# Patient Record
Sex: Female | Born: 1955 | Race: White | Hispanic: No | Marital: Married | Smoking: Never smoker
Health system: Southern US, Community
[De-identification: ages and names within clinical notes are randomized; demographics above are authoritative.]

## PROBLEM LIST (undated history)

## (undated) DIAGNOSIS — F419 Anxiety disorder, unspecified: Secondary | ICD-10-CM

## (undated) DIAGNOSIS — F32A Depression, unspecified: Secondary | ICD-10-CM

## (undated) DIAGNOSIS — M1990 Unspecified osteoarthritis, unspecified site: Secondary | ICD-10-CM

## (undated) DIAGNOSIS — L900 Lichen sclerosus et atrophicus: Secondary | ICD-10-CM

## (undated) DIAGNOSIS — E7800 Pure hypercholesterolemia, unspecified: Secondary | ICD-10-CM

## (undated) DIAGNOSIS — T7840XA Allergy, unspecified, initial encounter: Secondary | ICD-10-CM

## (undated) DIAGNOSIS — D219 Benign neoplasm of connective and other soft tissue, unspecified: Secondary | ICD-10-CM

## (undated) DIAGNOSIS — M810 Age-related osteoporosis without current pathological fracture: Secondary | ICD-10-CM

## (undated) DIAGNOSIS — F329 Major depressive disorder, single episode, unspecified: Secondary | ICD-10-CM

## (undated) HISTORY — DX: Allergy, unspecified, initial encounter: T78.40XA

## (undated) HISTORY — PX: COLONOSCOPY: SHX174

## (undated) HISTORY — DX: Age-related osteoporosis without current pathological fracture: M81.0

## (undated) HISTORY — PX: POLYPECTOMY: SHX149

## (undated) HISTORY — DX: Lichen sclerosus et atrophicus: L90.0

## (undated) HISTORY — DX: Depression, unspecified: F32.A

## (undated) HISTORY — DX: Unspecified osteoarthritis, unspecified site: M19.90

## (undated) HISTORY — DX: Anxiety disorder, unspecified: F41.9

## (undated) HISTORY — DX: Major depressive disorder, single episode, unspecified: F32.9

## (undated) HISTORY — DX: Benign neoplasm of connective and other soft tissue, unspecified: D21.9

## (undated) HISTORY — DX: Pure hypercholesterolemia, unspecified: E78.00

---
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
## 1995-07-27 HISTORY — PX: DIAGNOSTIC LAPAROSCOPY: SUR761

## 1998-03-26 ENCOUNTER — Other Ambulatory Visit: Admission: RE | Admit: 1998-03-26 | Discharge: 1998-03-26 | Payer: Self-pay | Admitting: Obstetrics and Gynecology

## 2000-06-10 ENCOUNTER — Encounter: Admission: RE | Admit: 2000-06-10 | Discharge: 2000-06-10 | Payer: Self-pay | Admitting: Obstetrics and Gynecology

## 2000-06-10 ENCOUNTER — Encounter: Payer: Self-pay | Admitting: Obstetrics and Gynecology

## 2000-11-14 ENCOUNTER — Encounter: Admission: RE | Admit: 2000-11-14 | Discharge: 2000-11-14 | Payer: Self-pay | Admitting: Family Medicine

## 2000-11-14 ENCOUNTER — Encounter: Payer: Self-pay | Admitting: Family Medicine

## 2001-10-18 ENCOUNTER — Other Ambulatory Visit: Admission: RE | Admit: 2001-10-18 | Discharge: 2001-10-18 | Payer: Self-pay | Admitting: Obstetrics and Gynecology

## 2002-01-03 ENCOUNTER — Encounter: Admission: RE | Admit: 2002-01-03 | Discharge: 2002-01-03 | Payer: Self-pay | Admitting: Family Medicine

## 2002-01-03 ENCOUNTER — Encounter: Payer: Self-pay | Admitting: Family Medicine

## 2002-01-31 ENCOUNTER — Encounter: Admission: RE | Admit: 2002-01-31 | Discharge: 2002-01-31 | Payer: Self-pay | Admitting: Family Medicine

## 2002-01-31 ENCOUNTER — Encounter: Payer: Self-pay | Admitting: Family Medicine

## 2003-02-27 ENCOUNTER — Encounter: Payer: Self-pay | Admitting: Obstetrics and Gynecology

## 2003-02-27 ENCOUNTER — Encounter: Admission: RE | Admit: 2003-02-27 | Discharge: 2003-02-27 | Payer: Self-pay | Admitting: Obstetrics and Gynecology

## 2003-03-18 ENCOUNTER — Other Ambulatory Visit: Admission: RE | Admit: 2003-03-18 | Discharge: 2003-03-18 | Payer: Self-pay | Admitting: Obstetrics and Gynecology

## 2004-09-04 ENCOUNTER — Ambulatory Visit (HOSPITAL_COMMUNITY): Admission: RE | Admit: 2004-09-04 | Discharge: 2004-09-04 | Payer: Self-pay | Admitting: Obstetrics and Gynecology

## 2004-09-07 ENCOUNTER — Other Ambulatory Visit: Admission: RE | Admit: 2004-09-07 | Discharge: 2004-09-07 | Payer: Self-pay | Admitting: Obstetrics and Gynecology

## 2005-09-10 ENCOUNTER — Other Ambulatory Visit: Admission: RE | Admit: 2005-09-10 | Discharge: 2005-09-10 | Payer: Self-pay | Admitting: Obstetrics and Gynecology

## 2005-09-17 ENCOUNTER — Ambulatory Visit (HOSPITAL_COMMUNITY): Admission: RE | Admit: 2005-09-17 | Discharge: 2005-09-17 | Payer: Self-pay | Admitting: Obstetrics and Gynecology

## 2006-09-09 ENCOUNTER — Ambulatory Visit: Payer: Self-pay | Admitting: Internal Medicine

## 2006-09-19 ENCOUNTER — Ambulatory Visit (HOSPITAL_COMMUNITY): Admission: RE | Admit: 2006-09-19 | Discharge: 2006-09-19 | Payer: Self-pay | Admitting: Obstetrics and Gynecology

## 2006-09-22 ENCOUNTER — Encounter (INDEPENDENT_AMBULATORY_CARE_PROVIDER_SITE_OTHER): Payer: Self-pay | Admitting: *Deleted

## 2006-09-22 ENCOUNTER — Ambulatory Visit: Payer: Self-pay | Admitting: Internal Medicine

## 2006-10-07 ENCOUNTER — Other Ambulatory Visit: Admission: RE | Admit: 2006-10-07 | Discharge: 2006-10-07 | Payer: Self-pay | Admitting: Obstetrics and Gynecology

## 2008-05-03 ENCOUNTER — Ambulatory Visit (HOSPITAL_COMMUNITY): Admission: RE | Admit: 2008-05-03 | Discharge: 2008-05-03 | Payer: Self-pay | Admitting: Obstetrics and Gynecology

## 2008-05-09 ENCOUNTER — Ambulatory Visit: Payer: Self-pay | Admitting: Obstetrics and Gynecology

## 2008-05-09 ENCOUNTER — Other Ambulatory Visit: Admission: RE | Admit: 2008-05-09 | Discharge: 2008-05-09 | Payer: Self-pay | Admitting: Obstetrics and Gynecology

## 2008-05-09 ENCOUNTER — Encounter: Payer: Self-pay | Admitting: Obstetrics and Gynecology

## 2008-06-17 ENCOUNTER — Ambulatory Visit: Payer: Self-pay | Admitting: Obstetrics and Gynecology

## 2010-08-15 ENCOUNTER — Encounter: Payer: Self-pay | Admitting: Obstetrics and Gynecology

## 2011-08-11 ENCOUNTER — Other Ambulatory Visit (HOSPITAL_COMMUNITY): Payer: Self-pay | Admitting: Family Medicine

## 2011-08-11 DIAGNOSIS — Z1231 Encounter for screening mammogram for malignant neoplasm of breast: Secondary | ICD-10-CM

## 2011-08-25 ENCOUNTER — Encounter: Payer: Self-pay | Admitting: Gynecology

## 2011-08-25 DIAGNOSIS — N809 Endometriosis, unspecified: Secondary | ICD-10-CM | POA: Insufficient documentation

## 2011-09-01 ENCOUNTER — Other Ambulatory Visit: Payer: Self-pay | Admitting: Obstetrics and Gynecology

## 2011-09-01 DIAGNOSIS — Z13820 Encounter for screening for osteoporosis: Secondary | ICD-10-CM

## 2011-09-02 ENCOUNTER — Other Ambulatory Visit: Payer: Self-pay | Admitting: Family Medicine

## 2011-09-07 ENCOUNTER — Ambulatory Visit (INDEPENDENT_AMBULATORY_CARE_PROVIDER_SITE_OTHER): Payer: PRIVATE HEALTH INSURANCE

## 2011-09-07 DIAGNOSIS — M810 Age-related osteoporosis without current pathological fracture: Secondary | ICD-10-CM

## 2011-09-07 DIAGNOSIS — Z13820 Encounter for screening for osteoporosis: Secondary | ICD-10-CM

## 2011-09-07 DIAGNOSIS — M8580 Other specified disorders of bone density and structure, unspecified site: Secondary | ICD-10-CM

## 2011-09-08 ENCOUNTER — Ambulatory Visit (HOSPITAL_COMMUNITY): Payer: Self-pay

## 2011-09-10 ENCOUNTER — Ambulatory Visit (HOSPITAL_COMMUNITY)
Admission: RE | Admit: 2011-09-10 | Discharge: 2011-09-10 | Disposition: A | Discharge status: Home / Self Care | Payer: PRIVATE HEALTH INSURANCE | Source: Ambulatory Visit | Attending: Family Medicine | Admitting: Family Medicine

## 2011-09-10 DIAGNOSIS — Z1231 Encounter for screening mammogram for malignant neoplasm of breast: Secondary | ICD-10-CM | POA: Insufficient documentation

## 2011-09-17 ENCOUNTER — Other Ambulatory Visit (HOSPITAL_COMMUNITY)
Admission: RE | Admit: 2011-09-17 | Discharge: 2011-09-17 | Disposition: A | Discharge status: Home / Self Care | Payer: PRIVATE HEALTH INSURANCE | Source: Ambulatory Visit | Attending: Obstetrics and Gynecology | Admitting: Obstetrics and Gynecology

## 2011-09-17 ENCOUNTER — Ambulatory Visit (INDEPENDENT_AMBULATORY_CARE_PROVIDER_SITE_OTHER): Payer: PRIVATE HEALTH INSURANCE | Admitting: Obstetrics and Gynecology

## 2011-09-17 ENCOUNTER — Encounter: Payer: Self-pay | Admitting: Obstetrics and Gynecology

## 2011-09-17 VITALS — BP 112/66 | Ht 65.0 in | Wt 122.5 lb

## 2011-09-17 DIAGNOSIS — Z01419 Encounter for gynecological examination (general) (routine) without abnormal findings: Secondary | ICD-10-CM | POA: Insufficient documentation

## 2011-09-17 DIAGNOSIS — F329 Major depressive disorder, single episode, unspecified: Secondary | ICD-10-CM | POA: Insufficient documentation

## 2011-09-17 DIAGNOSIS — E7800 Pure hypercholesterolemia, unspecified: Secondary | ICD-10-CM | POA: Insufficient documentation

## 2011-09-17 NOTE — Progress Notes (Signed)
Patient came to see me today for her annual GYN exam. She does her lab through PCP. She is up-to-date on mammograms. We did not get her last report and she will get it for [place]. She is having no hot flashes. She is having no pelvic pain. She is having no vaginal bleeding. She does have vaginal dryness which responds to estrogen cream. We just did a followup bone density on her and She has more bone loss and is now osteoporotic. She has a calcium rich diet. She takes 1000 IU of D. Her D. Level is normal on the above. She is trying to exercise more. She is a nonsmoker nondrinker.  Physical examination:  Karen Hurst present Physical examination: HEENT within normal limits. Neck: Thyroid not large. No masses. Supraclavicular nodes: not enlarged. Breasts: Examined in both sitting midline position. No skin changes and no masses. Abdomen: Soft no guarding rebound or masses or hernia. Pelvic: External: Within normal limits. BUS: Within normal limits. Vaginal:within normal limits. Good estrogen effect. No evidence of cystocele rectocele or enterocele. Cervix: clean. Uterus: Normal size and shape. Adnexa: No masses. Rectovaginal exam: Confirmatory and negative. Extremities: Within normal limits.  Assessment: #1. Osteoporosis #2. Atrophic vaginitis  Plan: Discussed treatment for osteoporosis. Discussed oral and injectables. We are to try to get her approved for Prolia. Booklet given. Patient to get me her calcium level and mammogram results.

## 2011-09-22 ENCOUNTER — Encounter: Payer: Self-pay | Admitting: Internal Medicine

## 2011-10-14 ENCOUNTER — Telehealth: Payer: Self-pay | Admitting: *Deleted

## 2011-10-14 MED ORDER — RISEDRONATE SODIUM 150 MG PO TABS
150.0000 mg | ORAL_TABLET | ORAL | Status: DC
Start: 2011-10-14 — End: 2012-10-21

## 2011-10-14 NOTE — Telephone Encounter (Signed)
Message copied by Libby Maw on Thu Oct 14, 2011  9:27 AM ------      Message from: Trellis Paganini      Created: Fri Sep 17, 2011 12:30 PM       Please get patient approved for Prolia. She has had a recent calcium level done. Please call her and tell her she's approved.

## 2011-10-14 NOTE — Telephone Encounter (Signed)
Lm for patient to call.  Insurance will not cover Prolia so DG wants to rx Actonel 150mg  per month instead

## 2011-10-14 NOTE — Telephone Encounter (Signed)
Patient informed information

## 2012-05-18 ENCOUNTER — Encounter: Payer: Self-pay | Admitting: Internal Medicine

## 2012-09-28 ENCOUNTER — Other Ambulatory Visit: Payer: Self-pay | Admitting: Gynecology

## 2012-09-28 ENCOUNTER — Encounter: Payer: Self-pay | Admitting: Internal Medicine

## 2012-09-28 DIAGNOSIS — Z1231 Encounter for screening mammogram for malignant neoplasm of breast: Secondary | ICD-10-CM

## 2012-10-04 ENCOUNTER — Ambulatory Visit (HOSPITAL_COMMUNITY)
Admission: RE | Admit: 2012-10-04 | Discharge: 2012-10-04 | Disposition: A | Discharge status: Home / Self Care | Payer: PRIVATE HEALTH INSURANCE | Source: Ambulatory Visit | Attending: Gynecology | Admitting: Gynecology

## 2012-10-04 DIAGNOSIS — Z1231 Encounter for screening mammogram for malignant neoplasm of breast: Secondary | ICD-10-CM | POA: Insufficient documentation

## 2012-10-13 ENCOUNTER — Ambulatory Visit (AMBULATORY_SURGERY_CENTER): Payer: PRIVATE HEALTH INSURANCE | Admitting: *Deleted

## 2012-10-13 VITALS — Ht 65.0 in | Wt 120.0 lb

## 2012-10-13 DIAGNOSIS — Z1211 Encounter for screening for malignant neoplasm of colon: Secondary | ICD-10-CM

## 2012-10-13 MED ORDER — MOVIPREP 100 G PO SOLR: ORAL | Status: DC

## 2012-10-17 ENCOUNTER — Encounter: Payer: Self-pay | Admitting: Internal Medicine

## 2012-10-19 ENCOUNTER — Ambulatory Visit (INDEPENDENT_AMBULATORY_CARE_PROVIDER_SITE_OTHER): Payer: Self-pay | Admitting: Gynecology

## 2012-10-19 ENCOUNTER — Encounter: Payer: Self-pay | Admitting: Gynecology

## 2012-10-19 VITALS — BP 120/70 | Ht 65.0 in | Wt 122.0 lb

## 2012-10-19 DIAGNOSIS — N952 Postmenopausal atrophic vaginitis: Secondary | ICD-10-CM

## 2012-10-19 DIAGNOSIS — Z01419 Encounter for gynecological examination (general) (routine) without abnormal findings: Secondary | ICD-10-CM

## 2012-10-19 DIAGNOSIS — N852 Hypertrophy of uterus: Secondary | ICD-10-CM

## 2012-10-19 DIAGNOSIS — M810 Age-related osteoporosis without current pathological fracture: Secondary | ICD-10-CM

## 2012-10-19 MED ORDER — RISEDRONATE SODIUM 150 MG PO TABS: 150.0000 mg | ORAL_TABLET | ORAL | Status: DC

## 2012-10-19 NOTE — Patient Instructions (Signed)
Follow up for ultrasound as scheduled Try Vagifem as discussed, call me in follow up as to how it is working and for refill Follow up in one year for annual exam

## 2012-10-19 NOTE — Progress Notes (Signed)
Charmane Protzman 07-11-1956 [id number]        57 y.o.  G2P2002 for annual exam.  Former patient of Dr. Eda Paschal. Several issues noted below.  Past medical history,surgical history, medications, allergies, family history and social history were all reviewed and documented in the EPIC chart. ROS:  Was performed and pertinent positives and negatives are included in the history.  Exam: Kim assistant Filed Vitals:   10/19/12 1409  BP: 120/70  Height: 5\' 5"  (1.651 m)  Weight: 122 lb (55.339 kg)   General appearance  Normal Skin grossly normal Head/Neck normal with no cervical or supraclavicular adenopathy thyroid normal Lungs  clear Cardiac RR, without RMG Abdominal  soft, nontender, without masses, organomegaly or hernia Breasts  examined lying and sitting without masses, retractions, discharge or axillary adenopathy. Pelvic  Ext/BUS/vagina  normal with atrophic genital changes  Cervix  normal with atrophic genital changes  Uterus  bulky, midline and mobile nontender   Adnexa  Without masses or tenderness    Anus and perineum  normal   Rectovaginal  normal sphincter tone without palpated masses or tenderness.    Assessment/Plan:  57 y.o. [id number] postmenopausal female for annual exam.   1. Bulky uterus on exam. Not noted previously on other exams. Recommended baseline ultrasound for better definition. Patient will followup for this. Various scenarios to include normal pelvis, leiomyoma or ovarian cystic process reviewed. 2. Atrophic vaginitis. Using Premarin vaginal cream several times weekly. Doing well with this. Does have occasional hot flashes and night sweats but overall is doing well. I reviewed options to include OTC products such as soy, moisturizers and lubricants. Estrogen cream, Vagifem and Osphena. HRT also discussed. Risks to include stroke heart attack DVT breast cancer reviewed. Possible absorption with vaginal treatments and uterine stimulation as well as other risks  reviewed. After lengthy discussion patient wants trial of Vagifem and I gave her 3 sample cards to try. As she is already on vaginal estrogen we'll start her with twice weekly dosage. She will call me at the end of her samples in followup. If doing well I'll refill the Vagifem if not we'll refill the estrogen cream. 3. Osteoporosis. DEXA 08/2011. T score -2.5. On Actonel 150 mg for one year. Doing well with this and I refilled her times a year. 4. Mammography 09/2012 normal. Does show some density to the breasts. We'll continue with annual mammography. I recommended she consider 3-D next year. SBE monthly reviewed. 5. Pap smear 08/2011. No Pap smear done today. No history of abnormal Pap smears. Plan repeat at 3 year interval per current screening guidelines. 6. Colonoscopy 2008. Repeat at their recommended interval. 7. Health maintenance. No blood work done as it is all done through her primary physician's office. Follow up for ultrasound and Vagifem results. Otherwise annually.    Dara Lords MD, 3:03 PM 10/19/2012

## 2012-10-21 ENCOUNTER — Other Ambulatory Visit: Payer: Self-pay | Admitting: Obstetrics and Gynecology

## 2012-10-23 ENCOUNTER — Encounter: Payer: Self-pay | Admitting: Gynecology

## 2012-10-23 ENCOUNTER — Ambulatory Visit (INDEPENDENT_AMBULATORY_CARE_PROVIDER_SITE_OTHER): Payer: PRIVATE HEALTH INSURANCE | Admitting: Gynecology

## 2012-10-23 ENCOUNTER — Other Ambulatory Visit: Payer: Self-pay | Admitting: Gynecology

## 2012-10-23 ENCOUNTER — Ambulatory Visit (INDEPENDENT_AMBULATORY_CARE_PROVIDER_SITE_OTHER): Payer: PRIVATE HEALTH INSURANCE

## 2012-10-23 DIAGNOSIS — D251 Intramural leiomyoma of uterus: Secondary | ICD-10-CM

## 2012-10-23 DIAGNOSIS — N852 Hypertrophy of uterus: Secondary | ICD-10-CM

## 2012-10-23 DIAGNOSIS — D252 Subserosal leiomyoma of uterus: Secondary | ICD-10-CM

## 2012-10-23 DIAGNOSIS — D259 Leiomyoma of uterus, unspecified: Secondary | ICD-10-CM

## 2012-10-23 DIAGNOSIS — N83339 Acquired atrophy of ovary and fallopian tube, unspecified side: Secondary | ICD-10-CM

## 2012-10-23 NOTE — Progress Notes (Signed)
Patient follows up for ultrasound. Uterus felt bulky at annual exam never appreciated previously. My first exam on this patient.  Ultrasound shows uterus enlarged with several myomas the largest measuring 65 mm, 39 mm, 31 mm, 23 mm. Endometrial echo unable to be visualized due to the myomas. Right and left ovaries atrophic in appearance. Cul-de-sac negative.  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 Patient's asymptomatic at this point. Question whether this is enlarging given normal exam last year by Dr. Eda Paschal or present but not appreciated previously. Issues of leiomyosarcoma also discussed. Questions were answered. Recommend three-month followup exam for stability and she agrees with this. Sooner if any issues or symptoms.

## 2012-10-23 NOTE — Patient Instructions (Signed)
3 month follow up exam

## 2012-10-27 ENCOUNTER — Ambulatory Visit (AMBULATORY_SURGERY_CENTER): Payer: PRIVATE HEALTH INSURANCE | Admitting: Internal Medicine

## 2012-10-27 ENCOUNTER — Encounter: Payer: Self-pay | Admitting: Internal Medicine

## 2012-10-27 VITALS — BP 92/35 | HR 65 | Temp 98.2°F | Resp 28 | Ht 65.0 in | Wt 120.0 lb

## 2012-10-27 DIAGNOSIS — D126 Benign neoplasm of colon, unspecified: Secondary | ICD-10-CM

## 2012-10-27 DIAGNOSIS — Z1211 Encounter for screening for malignant neoplasm of colon: Secondary | ICD-10-CM

## 2012-10-27 MED ORDER — SODIUM CHLORIDE 0.9 % IV SOLN: 500.0000 mL | INTRAVENOUS | Status: DC

## 2012-10-27 NOTE — Progress Notes (Signed)
943- Pt still very drowsy; has to continually be reminded to stay awake.  Husband helps pt dressed and she is taken to the conference room to wake up further before discharge

## 2012-10-27 NOTE — Patient Instructions (Addendum)

## 2012-10-27 NOTE — Op Note (Signed)
[facility] [address] [place], [address]   COLONOSCOPY PROCEDURE REPORT  PATIENT: Karen Hurst, Karen Hurst  MR#: [id number] BIRTHDATE: Aug 16, 1955 , 56  yrs. old GENDER: Female ENDOSCOPIST: Hart Carwin, MD REFERRED BY:  Mosetta Putt, M.D. PROCEDURE DATE:  10/27/2012 PROCEDURE:   Colonoscopy, screening ASA CLASS:   Class I INDICATIONS:Patient's personal history of adenomatous colon polyps ,adenomatous polyp 2008 MEDICATIONS: MAC sedation, administered by CRNA and propofol (Diprivan) 400mg  IV  DESCRIPTION OF PROCEDURE:   After the risks and benefits and of the procedure were explained, informed consent was obtained.  A digital rectal exam revealed no abnormalities of the rectum.    The LB [id number]  endoscope was introduced through the anus and advanced to the cecum, which was identified by both the appendix and ileocecal valve .  The quality of the prep was excellent, using MoviPrep .  The instrument was then slowly withdrawn as the colon was fully examined.     COLON FINDINGS: A polypoid shaped pedunculated polyp ranging between 5-27mm in size was found in the ascending colon.  A polypectomy was performed with cold forceps.  The resection was complete and the polyp tissue was completely retrieved.     Retroflexed views revealed no abnormalities.     The scope was then withdrawn from the patient and the procedure completed.  COMPLICATIONS: There were no complications. ENDOSCOPIC IMPRESSION: Pedunculated polyp ranging between 5-67mm in size was found in the ascending colon; polypectomy was performed with cold forceps  RECOMMENDATIONS: 1.  Await pathology results 2.  High fiber diet   REPEAT EXAM: for Colonoscopy, pending biopsy results.  Marland Kitchen  if the polyp is adenomatous, we will suggest recall in 5 years  cc:  _______________________________ eSigned:  Hart Carwin, MD 10/27/2012 9:03 AM

## 2012-10-27 NOTE — Progress Notes (Signed)
Called to room to assist during endoscopic procedure.  Patient ID and intended procedure confirmed with present staff. Received instructions for my participation in the procedure from the performing physician.  

## 2012-10-27 NOTE — Progress Notes (Signed)
Patient did not experience any of the following events: a burn prior to discharge; a fall within the facility; wrong site/side/patient/procedure/implant event; or a hospital transfer or hospital admission upon discharge from the facility. (G8907) Patient did not have preoperative order for IV antibiotic SSI prophylaxis. (G8918)  

## 2012-10-30 ENCOUNTER — Telehealth: Payer: Self-pay | Admitting: *Deleted

## 2012-10-30 NOTE — Telephone Encounter (Signed)
  Follow up Call-  Call back number 10/27/2012  Post procedure Call Back phone  # [phone] cell  Permission to leave phone message Yes     Patient questions:  Do you have a fever, pain , or abdominal swelling? no Pain Score  0 *  Have you tolerated food without any problems? yes  Have you been able to return to your normal activities? yes  Do you have any questions about your discharge instructions: Diet   no Medications  no Follow up visit  no  Do you have questions or concerns about your Care? yes  Actions: * If pain score is 4 or above: No action needed, pain <4.

## 2012-10-31 ENCOUNTER — Encounter: Payer: Self-pay | Admitting: Internal Medicine

## 2013-01-19 ENCOUNTER — Ambulatory Visit (INDEPENDENT_AMBULATORY_CARE_PROVIDER_SITE_OTHER): Payer: PRIVATE HEALTH INSURANCE | Admitting: Gynecology

## 2013-01-19 ENCOUNTER — Encounter: Payer: Self-pay | Admitting: Gynecology

## 2013-01-19 ENCOUNTER — Other Ambulatory Visit: Payer: PRIVATE HEALTH INSURANCE

## 2013-01-19 DIAGNOSIS — D251 Intramural leiomyoma of uterus: Secondary | ICD-10-CM

## 2013-01-19 NOTE — Progress Notes (Signed)
Patient presents for followup exam. Had newly discovered leiomyoma at her annual exam March 2014. Her uterus felt bulky and I ordered an ultrasound which showed multiple myomas the largest measuring 65 mm. I asked her return in 3 months for reexamination just to make sure that her uterus remained stable.  Exam was Radiographer, therapeutic External BUS vagina normal. Cervix normal With atrophic changes. Uterus retroverted bulky consistent with past exam. adnexa without masses or tenderness.  Assessment and plan: Leiomyoma, stable on exam. Followup March 2015 for annual exam. Sooner if any issues.

## 2013-01-19 NOTE — Patient Instructions (Signed)
Followup March 2015 for your annual exam.

## 2013-11-04 ENCOUNTER — Other Ambulatory Visit: Payer: Self-pay | Admitting: Obstetrics and Gynecology

## 2013-11-28 ENCOUNTER — Telehealth: Payer: Self-pay | Admitting: *Deleted

## 2013-11-28 ENCOUNTER — Other Ambulatory Visit: Payer: Self-pay | Admitting: Gynecology

## 2013-11-28 DIAGNOSIS — Z1231 Encounter for screening mammogram for malignant neoplasm of breast: Secondary | ICD-10-CM

## 2013-11-28 NOTE — Telephone Encounter (Signed)
Pt annual scheduled on 12/20/13 asked if dexa should be scheduled before annual or after I told pt she can schedule any time.

## 2013-11-30 ENCOUNTER — Ambulatory Visit (HOSPITAL_COMMUNITY)
Admission: RE | Admit: 2013-11-30 | Discharge: 2013-11-30 | Disposition: A | Discharge status: Home / Self Care | Payer: PRIVATE HEALTH INSURANCE | Source: Ambulatory Visit | Attending: Gynecology | Admitting: Gynecology

## 2013-11-30 DIAGNOSIS — Z1231 Encounter for screening mammogram for malignant neoplasm of breast: Secondary | ICD-10-CM | POA: Insufficient documentation

## 2013-12-20 ENCOUNTER — Encounter: Payer: Self-pay | Admitting: Gynecology

## 2013-12-20 ENCOUNTER — Ambulatory Visit (INDEPENDENT_AMBULATORY_CARE_PROVIDER_SITE_OTHER): Payer: PRIVATE HEALTH INSURANCE | Admitting: Gynecology

## 2013-12-20 VITALS — BP 120/76 | Ht 65.0 in | Wt 134.0 lb

## 2013-12-20 DIAGNOSIS — N952 Postmenopausal atrophic vaginitis: Secondary | ICD-10-CM

## 2013-12-20 DIAGNOSIS — Z01419 Encounter for gynecological examination (general) (routine) without abnormal findings: Secondary | ICD-10-CM

## 2013-12-20 DIAGNOSIS — D251 Intramural leiomyoma of uterus: Secondary | ICD-10-CM

## 2013-12-20 DIAGNOSIS — D219 Benign neoplasm of connective and other soft tissue, unspecified: Secondary | ICD-10-CM | POA: Insufficient documentation

## 2013-12-20 DIAGNOSIS — M810 Age-related osteoporosis without current pathological fracture: Secondary | ICD-10-CM

## 2013-12-20 MED ORDER — ESTRADIOL 10 MCG VA TABS: 1.0000 | ORAL_TABLET | VAGINAL | Status: DC

## 2013-12-20 NOTE — Progress Notes (Signed)
Karen Hurst Nov 26, 1955 [id number]        57 y.o.  G2P2002 for annual exam.  Several issues noted below.  Past medical history,surgical history, problem list, medications, allergies, family history and social history were all reviewed and documented as reviewed in the EPIC chart.  ROS:  12 system ROS performed with pertinent positives and negatives included in the history, assessment and plan.  Included Systems: General, HEENT, Neck, Cardiovascular, Pulmonary, Gastrointestinal, Genitourinary, Musculoskeletal, Dermatologic, Endocrine, Hematological, Neurologic, Psychiatric Additional significant findings :  None   Exam: Kim assistant Filed Vitals:   12/20/13 1542  BP: 120/76  Height: 5\' 5"  (1.651 m)  Weight: 134 lb (60.782 kg)   General appearance:  Normal affect, orientation and appearance. Skin: Grossly normal HEENT: Without gross lesions.  No cervical or supraclavicular adenopathy. Thyroid normal.  Lungs:  Clear without wheezing, rales or rhonchi Cardiac: RR, without RMG Abdominal:  Soft, nontender, without masses, guarding, rebound, organomegaly or hernia Breasts:  Examined lying and sitting without masses, retractions, discharge or axillary adenopathy. Pelvic:  Ext/BUS/vagina normal with generalized atrophic changes  Cervix normal  Uterus bulky retroverted midline mobile  Adnexa  Without masses or tenderness    Anus and perineum  Normal   Rectovaginal  Normal sphincter tone without palpated masses or tenderness.    Assessment/Plan:  58 y.o. [id number] female for annual exam.   1. Leiomyoma. First appreciated last year. Ultrasound showed multiple myomas. Exam stable from last year. Patient is asymptomatic. Will continue to monitor. 2. Postmenopausal/atrophic genital changes. Had been using Premarin vaginal cream for atrophic vaginitis. I gave her samples of Vagifem which she tried but then went back to the Premarin that she had left at home. She would like to try the Vagifem  again. Vagifem 10 mcg twice weekly one year supply prescription written. Risks to include absorption with risks of thrombosis breast cancer endometrial stimulation reviewed. Patient knows to call if any vaginal bleeding. 3. Osteoporosis. DEXA 08/2011 T score -2.5. On Actonel 150 mg monthly started 2013. Repeat DEXA now. Assuming stable then we'll continue on Actonel for a tentative 5 year course. Actonel refill x1 year provided. 4. Pap smear 2013. No Pap smear done today. No history of abnormal Pap smears. Plan repeat Pap smear next year a 3 year interval. 5. Mammography 11/2013. Continue with annual mammography. S/P monthly reviewed. 6. Colonoscopy 2014. Repeat at their recommended interval. 7. Health maintenance. No routine blood work done as she reports this done at her primary physician's office. Followup one year, sooner as needed.   Note: This document was prepared with digital dictation and possible smart phrase technology. Any transcriptional errors that result from this process are unintentional.   Anastasio Auerbach MD, 4:20 PM 12/20/2013

## 2013-12-20 NOTE — Patient Instructions (Signed)
Start on the Vagifem tablets twice weekly in the vagina. Call me if you have any issues with this. Followup for the bone density is scheduled. Followup in one year for annual exam, sooner as needed.  You may obtain a copy of any labs that were done today by logging onto MyChart as outlined in the instructions provided with your AVS (after visit summary). The office will not call with normal lab results but certainly if there are any significant abnormalities then we will contact you.   Health Maintenance, Female A healthy lifestyle and preventative care can promote health and wellness.  Maintain regular health, dental, and eye exams.  Eat a healthy diet. Foods like vegetables, fruits, whole grains, low-fat dairy products, and lean protein foods contain the nutrients you need without too many calories. Decrease your intake of foods high in solid fats, added sugars, and salt. Get information about a proper diet from your caregiver, if necessary.  Regular physical exercise is one of the most important things you can do for your health. Most adults should get at least 150 minutes of moderate-intensity exercise (any activity that increases your heart rate and causes you to sweat) each week. In addition, most adults need muscle-strengthening exercises on 2 or more days a week.   Maintain a healthy weight. The body mass index (BMI) is a screening tool to identify possible weight problems. It provides an estimate of body fat based on height and weight. Your caregiver can help determine your BMI, and can help you achieve or maintain a healthy weight. For adults 20 years and older:  A BMI below 18.5 is considered underweight.  A BMI of 18.5 to 24.9 is normal.  A BMI of 25 to 29.9 is considered overweight.  A BMI of 30 and above is considered obese.  Maintain normal blood lipids and cholesterol by exercising and minimizing your intake of saturated fat. Eat a balanced diet with plenty of fruits and  vegetables. Blood tests for lipids and cholesterol should begin at age 24 and be repeated every 5 years. If your lipid or cholesterol levels are high, you are over 50, or you are a high risk for heart disease, you may need your cholesterol levels checked more frequently.Ongoing high lipid and cholesterol levels should be treated with medicines if diet and exercise are not effective.  If you smoke, find out from your caregiver how to quit. If you do not use tobacco, do not start.  Lung cancer screening is recommended for adults aged 53 80 years who are at high risk for developing lung cancer because of a history of smoking. Yearly low-dose computed tomography (CT) is recommended for people who have at least a 30-pack-year history of smoking and are a current smoker or have quit within the past 15 years. A pack year of smoking is smoking an average of 1 pack of cigarettes a day for 1 year (for example: 1 pack a day for 30 years or 2 packs a day for 15 years). Yearly screening should continue until the smoker has stopped smoking for at least 15 years. Yearly screening should also be stopped for people who develop a health problem that would prevent them from having lung cancer treatment.  If you are pregnant, do not drink alcohol. If you are breastfeeding, be very cautious about drinking alcohol. If you are not pregnant and choose to drink alcohol, do not exceed 1 drink per day. One drink is considered to be 12 ounces (355 mL) of  beer, 5 ounces (148 mL) of wine, or 1.5 ounces (44 mL) of liquor.  Avoid use of street drugs. Do not share needles with anyone. Ask for help if you need support or instructions about stopping the use of drugs.  High blood pressure causes heart disease and increases the risk of stroke. Blood pressure should be checked at least every 1 to 2 years. Ongoing high blood pressure should be treated with medicines, if weight loss and exercise are not effective.  If you are 74 to 59 years  old, ask your caregiver if you should take aspirin to prevent strokes.  Diabetes screening involves taking a blood sample to check your fasting blood sugar level. This should be done once every 3 years, after age 69, if you are within normal weight and without risk factors for diabetes. Testing should be considered at a younger age or be carried out more frequently if you are overweight and have at least 1 risk factor for diabetes.  Breast cancer screening is essential preventative care for women. You should practice "breast self-awareness." This means understanding the normal appearance and feel of your breasts and may include breast self-examination. Any changes detected, no matter how small, should be reported to a caregiver. Women in their 96s and 30s should have a clinical breast exam (CBE) by a caregiver as part of a regular health exam every 1 to 3 years. After age 42, women should have a CBE every year. Starting at age 6, women should consider having a mammogram (breast X-ray) every year. Women who have a family history of breast cancer should talk to their caregiver about genetic screening. Women at a high risk of breast cancer should talk to their caregiver about having an MRI and a mammogram every year.  Breast cancer gene (BRCA)-related cancer risk assessment is recommended for women who have family members with BRCA-related cancers. BRCA-related cancers include breast, ovarian, tubal, and peritoneal cancers. Having family members with these cancers may be associated with an increased risk for harmful changes (mutations) in the breast cancer genes BRCA1 and BRCA2. Results of the assessment will determine the need for genetic counseling and BRCA1 and BRCA2 testing.  The Pap test is a screening test for cervical cancer. Women should have a Pap test starting at age 16. Between ages 85 and 67, Pap tests should be repeated every 2 years. Beginning at age 25, you should have a Pap test every 3 years  as long as the past 3 Pap tests have been normal. If you had a hysterectomy for a problem that was not cancer or a condition that could lead to cancer, then you no longer need Pap tests. If you are between ages 64 and 18, and you have had normal Pap tests going back 10 years, you no longer need Pap tests. If you have had past treatment for cervical cancer or a condition that could lead to cancer, you need Pap tests and screening for cancer for at least 20 years after your treatment. If Pap tests have been discontinued, risk factors (such as a new sexual partner) need to be reassessed to determine if screening should be resumed. Some women have medical problems that increase the chance of getting cervical cancer. In these cases, your caregiver may recommend more frequent screening and Pap tests.  The human papillomavirus (HPV) test is an additional test that may be used for cervical cancer screening. The HPV test looks for the virus that can cause the cell changes on  the cervix. The cells collected during the Pap test can be tested for HPV. The HPV test could be used to screen women aged 37 years and older, and should be used in women of any age who have unclear Pap test results. After the age of 63, women should have HPV testing at the same frequency as a Pap test.  Colorectal cancer can be detected and often prevented. Most routine colorectal cancer screening begins at the age of 48 and continues through age 43. However, your caregiver may recommend screening at an earlier age if you have risk factors for colon cancer. On a yearly basis, your caregiver may provide home test kits to check for hidden blood in the stool. Use of a small camera at the end of a tube, to directly examine the colon (sigmoidoscopy or colonoscopy), can detect the earliest forms of colorectal cancer. Talk to your caregiver about this at age 73, when routine screening begins. Direct examination of the colon should be repeated every 5 to 10  years through age 30, unless early forms of pre-cancerous polyps or small growths are found.  Hepatitis C blood testing is recommended for all people born from [birth year] through 1966 and any individual with known risks for hepatitis C.  Practice safe sex. Use condoms and avoid high-risk sexual practices to reduce the spread of sexually transmitted infections (STIs). Sexually active women aged 28 and younger should be checked for Chlamydia, which is a common sexually transmitted infection. Older women with new or multiple partners should also be tested for Chlamydia. Testing for other STIs is recommended if you are sexually active and at increased risk.  Osteoporosis is a disease in which the bones lose minerals and strength with aging. This can result in serious bone fractures. The risk of osteoporosis can be identified using a bone density scan. Women ages 84 and over and women at risk for fractures or osteoporosis should discuss screening with their caregivers. Ask your caregiver whether you should be taking a calcium supplement or vitamin D to reduce the rate of osteoporosis.  Menopause can be associated with physical symptoms and risks. Hormone replacement therapy is available to decrease symptoms and risks. You should talk to your caregiver about whether hormone replacement therapy is right for you.  Use sunscreen. Apply sunscreen liberally and repeatedly throughout the day. You should seek shade when your shadow is shorter than you. Protect yourself by wearing long sleeves, pants, a wide-brimmed hat, and sunglasses year round, whenever you are outdoors.  Notify your caregiver of new moles or changes in moles, especially if there is a change in shape or color. Also notify your caregiver if a mole is larger than the size of a pencil eraser.  Stay current with your immunizations. Document Released: 01/25/2011 Document Revised: 11/06/2012 Document Reviewed: 01/25/2011 [facility] Patient Information 2014  [place].

## 2013-12-24 DIAGNOSIS — M810 Age-related osteoporosis without current pathological fracture: Secondary | ICD-10-CM

## 2013-12-24 HISTORY — DX: Age-related osteoporosis without current pathological fracture: M81.0

## 2013-12-27 ENCOUNTER — Other Ambulatory Visit: Payer: Self-pay | Admitting: Gynecology

## 2014-01-17 ENCOUNTER — Ambulatory Visit (INDEPENDENT_AMBULATORY_CARE_PROVIDER_SITE_OTHER): Payer: PRIVATE HEALTH INSURANCE

## 2014-01-17 ENCOUNTER — Encounter: Payer: Self-pay | Admitting: Gynecology

## 2014-01-17 DIAGNOSIS — M810 Age-related osteoporosis without current pathological fracture: Secondary | ICD-10-CM

## 2014-01-24 ENCOUNTER — Other Ambulatory Visit: Payer: Self-pay | Admitting: Gynecology

## 2014-05-10 ENCOUNTER — Other Ambulatory Visit: Payer: Self-pay

## 2014-05-27 ENCOUNTER — Encounter: Payer: Self-pay | Admitting: Gynecology

## 2015-01-21 ENCOUNTER — Other Ambulatory Visit: Payer: Self-pay | Admitting: Gynecology

## 2016-06-25 ENCOUNTER — Other Ambulatory Visit: Payer: Self-pay | Admitting: Family Medicine

## 2016-06-25 DIAGNOSIS — M810 Age-related osteoporosis without current pathological fracture: Secondary | ICD-10-CM

## 2016-06-25 DIAGNOSIS — Z1231 Encounter for screening mammogram for malignant neoplasm of breast: Secondary | ICD-10-CM

## 2016-07-01 ENCOUNTER — Other Ambulatory Visit: Payer: Self-pay | Admitting: Family Medicine

## 2016-07-01 DIAGNOSIS — I421 Obstructive hypertrophic cardiomyopathy: Secondary | ICD-10-CM

## 2016-07-15 ENCOUNTER — Other Ambulatory Visit: Payer: Self-pay

## 2016-07-15 ENCOUNTER — Ambulatory Visit (HOSPITAL_COMMUNITY): Payer: BLUE CROSS/BLUE SHIELD | Attending: Internal Medicine

## 2016-07-15 DIAGNOSIS — I421 Obstructive hypertrophic cardiomyopathy: Secondary | ICD-10-CM | POA: Diagnosis not present

## 2016-07-15 DIAGNOSIS — I517 Cardiomegaly: Secondary | ICD-10-CM | POA: Diagnosis not present

## 2016-08-02 ENCOUNTER — Ambulatory Visit
Admission: RE | Admit: 2016-08-02 | Discharge: 2016-08-02 | Disposition: A | Discharge status: Home / Self Care | Payer: BLUE CROSS/BLUE SHIELD | Source: Ambulatory Visit | Attending: Family Medicine | Admitting: Family Medicine

## 2016-08-02 DIAGNOSIS — Z1231 Encounter for screening mammogram for malignant neoplasm of breast: Secondary | ICD-10-CM

## 2016-08-02 DIAGNOSIS — M810 Age-related osteoporosis without current pathological fracture: Secondary | ICD-10-CM

## 2016-09-23 DIAGNOSIS — L900 Lichen sclerosus et atrophicus: Secondary | ICD-10-CM

## 2016-09-23 HISTORY — DX: Lichen sclerosus et atrophicus: L90.0

## 2016-09-30 ENCOUNTER — Encounter: Payer: Self-pay | Admitting: Gynecology

## 2016-09-30 ENCOUNTER — Ambulatory Visit (INDEPENDENT_AMBULATORY_CARE_PROVIDER_SITE_OTHER): Payer: BLUE CROSS/BLUE SHIELD | Admitting: Gynecology

## 2016-09-30 VITALS — BP 118/76 | Ht 65.0 in | Wt 144.0 lb

## 2016-09-30 DIAGNOSIS — N9089 Other specified noninflammatory disorders of vulva and perineum: Secondary | ICD-10-CM | POA: Diagnosis not present

## 2016-09-30 DIAGNOSIS — N952 Postmenopausal atrophic vaginitis: Secondary | ICD-10-CM

## 2016-09-30 DIAGNOSIS — N393 Stress incontinence (female) (male): Secondary | ICD-10-CM

## 2016-09-30 DIAGNOSIS — D259 Leiomyoma of uterus, unspecified: Secondary | ICD-10-CM

## 2016-09-30 DIAGNOSIS — M810 Age-related osteoporosis without current pathological fracture: Secondary | ICD-10-CM

## 2016-09-30 DIAGNOSIS — Z01411 Encounter for gynecological examination (general) (routine) with abnormal findings: Secondary | ICD-10-CM | POA: Diagnosis not present

## 2016-09-30 LAB — URINALYSIS W MICROSCOPIC + REFLEX CULTURE
Bilirubin Urine: NEGATIVE
CASTS: NONE SEEN [LPF]
CRYSTALS: NONE SEEN [HPF]
GLUCOSE, UA: NEGATIVE
Hgb urine dipstick: NEGATIVE
Ketones, ur: NEGATIVE
Leukocytes, UA: NEGATIVE
NITRITE: NEGATIVE
PROTEIN: NEGATIVE
RBC / HPF: NONE SEEN RBC/HPF (ref <=2)
Specific Gravity, Urine: >1.03 (ref 1.001–1.035)
Yeast: NONE SEEN [HPF]
pH: 5 (ref 5.0–8.0)

## 2016-09-30 LAB — WET PREP FOR TRICH, YEAST, CLUE
CLUE CELLS WET PREP: NONE SEEN
TRICH WET PREP: NONE SEEN
YEAST WET PREP: NONE SEEN

## 2016-09-30 MED ORDER — RISEDRONATE SODIUM 150 MG PO TABS: ORAL_TABLET | ORAL | 11 refills | Status: DC

## 2016-09-30 NOTE — Patient Instructions (Signed)
Follow up for vulvar biopsy as scheduled 

## 2016-09-30 NOTE — Progress Notes (Signed)
Karen Hurst 21-Oct-1955 [id number]        61 y.o.  G2P2002 for annual exam.  Also complaining of vulvar itching. Was recently treated for URI with antibiotics and noted that she had a lot of itching seem to follow this. Has been using OTC soothing products externally without relief. Notes during the respiratory illness with coughing she was having a lot of urinary incontinence. She has been having some issues with this all along but seemed much worse with laughing coughing sneezing over the last month or so. No urgency symptoms, dysuria low back pain fever or chills.  Past medical history,surgical history, problem list, medications, allergies, family history and social history were all reviewed and documented as reviewed in the EPIC chart.  ROS:  Performed with pertinent positives and negatives included in the history, assessment and plan.   Additional significant findings :  None   Exam: [name] assistant Vitals:   09/30/16 1517  BP: 118/76  Weight: 144 lb (65.3 kg)  Height: 5\' 5"  (1.651 m)   Body mass index is 23.96 kg/m.  General appearance:  Normal affect, orientation and appearance. Skin: Grossly normal HEENT: Without gross lesions.  No cervical or supraclavicular adenopathy. Thyroid normal.  Lungs:  Clear without wheezing, rales or rhonchi Cardiac: RR, without RMG Abdominal:  Soft, nontender, without masses, guarding, rebound, organomegaly or hernia Breasts:  Examined lying and sitting without masses, retractions, discharge or axillary adenopathy. Pelvic:  Ext, BUS, Vagina: With atrophic changes. Symmetrical vitiligo changes from periclitoral hood down both labia minora/labia majora to the posterior fourchette consistent with lichen sclerosus. No significant discharge noted.  Cervix: Atrophic with Pap smear done  Uterus: Bulky, retroverted consistent with history of leiomyoma. Midline nontender  Adnexa: Without masses or tenderness    Anus and perineum: Normal    Rectovaginal: Normal sphincter tone without palpated masses or tenderness.    Assessment/Plan:  61 y.o. [id number] female for annual exam.   1. Vulvar irritation. Initially linked to treat with antibiotics. Exam suspicious for lichen sclerosis. Wet prep today is negative. Recommended follow up appointment for vulvar biopsy for definitive diagnoses and ultimate treatment. Patient will schedule follow up with me for this. Differential discussed with her to include lichen sclerosus, lichen simplex chronicus and other dermatologic issues. I reviewed treatment options if lichen sclerosus to include clobetasol cream intermittently as needed for symptoms. 2. History of leiomyoma. Ultrasound 2014 showed multiple myomas. Exam overall stable from prior exam. Will follow with annual exams as patient is asymptomatic. 3. Urinary incontinence consistent with stress symptoms. No urgency component. Urinalysis today does show a few bacteria but otherwise unremarkable. We'll culture and treat if positive. Options for management reviewed to include behavior modification up to including surgery. At this point patient does not want referral to urology but will let me know if she changes her mind. 4. Osteoporosis. DEXA 2018 T score -2.6. Had been treated with Actonel 150 mg monthly started 2013 and continued for approximately 4 years. Discontinued for one to 2 years and then reinitiated recently when she saw her primary physician as Actonel weekly. Recommend continuing Actonel monthly for now with repeat DEXA in 2 years. She reports having a recent vitamin D level also checked which was normal. Risks of Actonel again reviewed. Appears to be tolerating it well now. 5. Postmenopausal/atrophic genital changes. Had used vaginal estrogen cream in the past. Notes some dryness but prefers not to use anything at this time. 6. Pap smear 2013. Pap smear done  today. No history of abnormal Pap smears previously. 7. Mammography 07/2016.  Continue with annual mammography when due. SBE monthly reviewed. 8. Colonoscopy 2014. Repeat at their recommended interval. 9. Health maintenance. No routine lab work done as patient does this elsewhere. Follow up for vulvar biopsy. Follow up in one year for annual exam.  Additional time in excess of her routine gynecologic exam was spent in direct face to face counseling and coordination of care in regards to her vulvar irritation and probable lichen sclerosus diagnoses as well as her urinary incontinence.    Anastasio Auerbach MD, 3:53 PM 09/30/2016

## 2016-09-30 NOTE — Addendum Note (Signed)
Addended by: Nelva Nay on: 09/30/2016 04:21 PM   Modules accepted: Orders

## 2016-10-01 LAB — PAP IG W/ RFLX HPV ASCU

## 2016-10-02 LAB — URINE CULTURE: ORGANISM ID, BACTERIA: NO GROWTH

## 2016-10-14 ENCOUNTER — Ambulatory Visit (INDEPENDENT_AMBULATORY_CARE_PROVIDER_SITE_OTHER): Payer: BLUE CROSS/BLUE SHIELD | Admitting: Gynecology

## 2016-10-14 VITALS — BP 124/82

## 2016-10-14 DIAGNOSIS — N9089 Other specified noninflammatory disorders of vulva and perineum: Secondary | ICD-10-CM

## 2016-10-14 NOTE — Patient Instructions (Signed)
Office will call you with biopsy results 

## 2016-10-14 NOTE — Progress Notes (Signed)
    Karen Hurst 10-Feb-1956 [id number]        60 y.o.  G2P2002 presents for biopsy. Recently evaluated at annual exam and found to have symmetrical blanching of the skin from the periclitoral to perianal region consistent with lichen sclerosis. Patient also complaining of vulvar irritation.  Past medical history,surgical history, problem list, medications, allergies, family history and social history were all reviewed and documented in the EPIC chart.  Directed ROS with pertinent positives and negatives documented in the history of present illness/assessment and plan.  Exam: Blanca assistant Vitals:   10/14/16 0810  BP: 124/82   General appearance:  Normal External BUS vagina with atrophic changes. Symmetrical blanching of the skin from periclitoral to perianal region consistent with lichen sclerosis.  Procedure: Representative area left lower labia majora was cleansed with Betadine, infiltrated with 1% lidocaine and 2 small representative biopsies taken. Several nitrate hemostasis applied afterwards. Patient tolerated well. Physical Exam  Genitourinary:        Assessment/Plan:  61 y.o. [id number] with suspected lichen sclerosus. Patient will follow up for biopsy results. Discussed treatment with clobetasol cream and how to use this on an intermittent basis as needed for symptoms. Will await biopsy results and then discuss afterwards.    Anastasio Auerbach MD, 8:22 AM 10/14/2016

## 2016-10-15 ENCOUNTER — Other Ambulatory Visit: Payer: Self-pay | Admitting: Gynecology

## 2016-10-15 ENCOUNTER — Encounter: Payer: Self-pay | Admitting: Gynecology

## 2016-10-15 LAB — PATHOLOGY

## 2016-10-15 MED ORDER — CLOBETASOL PROPIONATE 0.05 % EX CREA: TOPICAL_CREAM | CUTANEOUS | 2 refills | Status: AC

## 2017-10-03 ENCOUNTER — Ambulatory Visit (INDEPENDENT_AMBULATORY_CARE_PROVIDER_SITE_OTHER): Payer: 59 | Admitting: Gynecology

## 2017-10-03 ENCOUNTER — Encounter: Payer: BLUE CROSS/BLUE SHIELD | Admitting: Gynecology

## 2017-10-03 ENCOUNTER — Encounter: Payer: Self-pay | Admitting: Gynecology

## 2017-10-03 VITALS — BP 118/74 | Ht 65.0 in | Wt 144.0 lb

## 2017-10-03 DIAGNOSIS — N393 Stress incontinence (female) (male): Secondary | ICD-10-CM | POA: Diagnosis not present

## 2017-10-03 DIAGNOSIS — N952 Postmenopausal atrophic vaginitis: Secondary | ICD-10-CM

## 2017-10-03 DIAGNOSIS — D259 Leiomyoma of uterus, unspecified: Secondary | ICD-10-CM | POA: Diagnosis not present

## 2017-10-03 DIAGNOSIS — M810 Age-related osteoporosis without current pathological fracture: Secondary | ICD-10-CM

## 2017-10-03 DIAGNOSIS — Z01411 Encounter for gynecological examination (general) (routine) with abnormal findings: Secondary | ICD-10-CM

## 2017-10-03 DIAGNOSIS — L900 Lichen sclerosus et atrophicus: Secondary | ICD-10-CM

## 2017-10-03 NOTE — Patient Instructions (Signed)
Follow-up in 1 year for annual exam, sooner if any issues. 

## 2017-10-03 NOTE — Progress Notes (Signed)
    Karen Hurst 1955-09-20 [id number]        61 y.o.  G2P2002 for annual gynecologic exam.  Several issues noted below.  Past medical history,surgical history, problem list, medications, allergies, family history and social history were all reviewed and documented as reviewed in the EPIC chart.  ROS:  Performed with pertinent positives and negatives included in the history, assessment and plan.   Additional significant findings : None   Exam: [name] assistant Vitals:   10/03/17 1115  BP: 118/74  Weight: 144 lb (65.3 kg)  Height: 5\' 5"  (1.651 m)   Body mass index is 23.96 kg/m.  General appearance:  Normal affect, orientation and appearance. Skin: Grossly normal HEENT: Without gross lesions.  No cervical or supraclavicular adenopathy. Thyroid normal.  Lungs:  Clear without wheezing, rales or rhonchi Cardiac: RR, without RMG Abdominal:  Soft, nontender, without masses, guarding, rebound, organomegaly or hernia Breasts:  Examined lying and sitting without masses, retractions, discharge or axillary adenopathy. Pelvic:  Ext, BUS, Vagina: With atrophic changes.  Vitiligo symmetrical changes mid to lower outer labia minora bilaterally.  Cervix: With atrophic changes  Uterus: Retroverted bulky consistent with leiomyoma  Adnexa: Without masses or tenderness    Anus and perineum: Normal   Rectovaginal: Normal sphincter tone without palpated masses or tenderness.    Assessment/Plan:  62 y.o. G73P2002 female for annual gynecologic exam.   1. Postmenopausal/atrophic genital changes.  No significant hot flushes, night sweats, vaginal dryness or any bleeding. 2. Lichen sclerosis, biopsy proven.  Started on clobetasol 0.05% cream last year doing well.  Uses intermittently to treat flares of itching or irritation.  Has supply but will call when needs more. 3. Leiomyoma.  Uterus bulky stable on exam.  Patient asymptomatic.  Continue to monitor and report any issues. 4. Urinary  incontinence, stress.  Classic stress-like symptoms particularly when her bladder is full.  Behavior modification such as more frequent bladder emptying and Kegel exercises reviewed.  Options for surgery discussed although patient does not feel it warrants intervention at this time.  Check urine analysis today. 5. Osteoporosis.  DEXA 2018 T score -2.6.  Had been on Actonel 150 mg for approximately 4 years stopping in 2017.  Was reinitiated last year by her primary physician but ultimately never took for any length of time due to insurance issues.  We discussed today the issues of restarting now versus waiting and rechecking her bone density in 1 year and then going from there.  Patient is comfortable with waiting and not starting therapy at this time. 6. Pap smear 2018.  No Pap smear done today.  No history of abnormal Pap smears.  Plan repeat Pap smear at 3-year interval per current screening guidelines. 7. Mammography due now and I reminded patient to schedule this.  Breast exam normal today. 8. Colonoscopy 2014.  Repeat at their recommended interval. 9. Health maintenance.  No routine lab work done as patient reports this done elsewhere.  Follow-up 1 year, sooner as needed.   Anastasio Auerbach MD, 11:41 AM 10/03/2017

## 2017-10-03 NOTE — Addendum Note (Signed)
Addended by: Joaquin Music on: 10/03/2017 03:09 PM   Modules accepted: Orders

## 2017-11-08 ENCOUNTER — Encounter: Payer: Self-pay | Admitting: Gastroenterology

## 2018-06-07 LAB — GLUCOSE, POCT (MANUAL RESULT ENTRY): POC Glucose: 103 mg/dl — AB (ref 70–99)

## 2018-10-05 ENCOUNTER — Ambulatory Visit (INDEPENDENT_AMBULATORY_CARE_PROVIDER_SITE_OTHER): Payer: 59 | Admitting: Gynecology

## 2018-10-05 ENCOUNTER — Encounter: Payer: Self-pay | Admitting: Gynecology

## 2018-10-05 ENCOUNTER — Other Ambulatory Visit: Payer: Self-pay

## 2018-10-05 VITALS — BP 118/76 | Ht 65.0 in | Wt 129.0 lb

## 2018-10-05 DIAGNOSIS — M810 Age-related osteoporosis without current pathological fracture: Secondary | ICD-10-CM | POA: Diagnosis not present

## 2018-10-05 DIAGNOSIS — N952 Postmenopausal atrophic vaginitis: Secondary | ICD-10-CM | POA: Diagnosis not present

## 2018-10-05 DIAGNOSIS — L900 Lichen sclerosus et atrophicus: Secondary | ICD-10-CM | POA: Diagnosis not present

## 2018-10-05 DIAGNOSIS — D259 Leiomyoma of uterus, unspecified: Secondary | ICD-10-CM

## 2018-10-05 DIAGNOSIS — Z01419 Encounter for gynecological examination (general) (routine) without abnormal findings: Secondary | ICD-10-CM

## 2018-10-05 NOTE — Progress Notes (Signed)
    Karen Hurst July 18, 1956 [id number]        62 y.o.  G2P2002 for annual gynecologic exam.  Without gynecologic complaints.  Past medical history,surgical history, problem list, medications, allergies, family history and social history were all reviewed and documented as reviewed in the EPIC chart.  ROS:  Performed with pertinent positives and negatives included in the history, assessment and plan.   Additional significant findings : None   Exam: [name] assistant Vitals:   10/05/18 1135  BP: 118/76  Weight: 129 lb (58.5 kg)  Height: 5\' 5"  (1.651 m)   Body mass index is 21.47 kg/m.  General appearance:  Normal affect, orientation and appearance. Skin: Grossly normal HEENT: Without gross lesions.  No cervical or supraclavicular adenopathy. Thyroid normal.  Lungs:  Clear without wheezing, rales or rhonchi Cardiac: RR, without RMG Abdominal:  Soft, nontender, without masses, guarding, rebound, organomegaly or hernia Breasts:  Examined lying and sitting without masses, retractions, discharge or axillary adenopathy. Pelvic:  Ext, BUS, Vagina: With atrophic changes.  Mild blanching of the skin mid labia bilaterally  Cervix: With atrophic changes  Uterus: Retroverted, grossly normal size, shape and contour, midline and mobile nontender   Adnexa: Without masses or tenderness    Anus and perineum: Normal   Rectovaginal: Normal sphincter tone without palpated masses or tenderness.    Assessment/Plan:  63 y.o. G53P2002 female for annual gynecologic exam.   1. Postmenopausal.  No significant menopausal symptoms or any vaginal bleeding. 2. History of lichen sclerosis, biopsy proven.  Exam consistent with this.  Uses clobetasol 0.05% cream occasionally for symptom relief.  Has supply but will call when needs more. 3. History of leiomyoma.  Uterus actually palpates normal size this year.  We will continue with annual monitoring exams. 4. Osteoporosis.  DEXA 2018 T score -2.6.   Actonel 150 mg monthly x4 years stopped 2017.  Recommend follow-up DEXA now at 2-year interval when she is going to arrange at the breast center where she had her last bone density. 5. Mammography 07/2016.  Reminded patient she is overdue and she is going to arrange along with her bone density at the breast center.  Breast exam normal today. 6. Colonoscopy 2014.  Repeat at their recommended interval. 7. Health maintenance.  No routine lab work done as patient does this elsewhere.  Follow-up 1 year, sooner as needed.   Anastasio Auerbach MD, 12:11 PM 10/05/2018

## 2018-10-05 NOTE — Patient Instructions (Signed)
Schedule your mammogram and bone density at the breast center.  Follow-up in 1 year for annual exam.

## 2019-02-02 ENCOUNTER — Encounter: Payer: Self-pay | Admitting: Gastroenterology

## 2019-02-02 ENCOUNTER — Telehealth: Payer: Self-pay | Admitting: *Deleted

## 2019-02-02 DIAGNOSIS — M810 Age-related osteoporosis without current pathological fracture: Secondary | ICD-10-CM

## 2019-02-02 NOTE — Telephone Encounter (Signed)
Patient called requesting Dexa order at the breast center, order placed, patient will call to schedule.

## 2019-02-22 ENCOUNTER — Other Ambulatory Visit: Payer: Self-pay

## 2019-02-22 ENCOUNTER — Ambulatory Visit: Payer: 59 | Admitting: *Deleted

## 2019-02-22 VITALS — Ht 65.0 in | Wt 129.0 lb

## 2019-02-22 DIAGNOSIS — Z86010 Personal history of colonic polyps: Secondary | ICD-10-CM

## 2019-02-22 MED ORDER — SUPREP BOWEL PREP KIT 17.5-3.13-1.6 GM/177ML PO SOLN: 1.0000 | Freq: Once | ORAL | 0 refills | Status: AC

## 2019-02-22 NOTE — Progress Notes (Signed)
previsit via telephone related to [place] pandemic. ID per name,dob and address  No egg or soy allergy known to patient  No issues with past sedation with any surgeries  or procedures, no intubation problems  No diet pills per patient No home 02 use per patient  No blood thinners per patient  Pt denies issues with constipation  No A fib or A flutter  EMMI  Information,consent form, acknowledgement form with stamped envelope to return,suprep coupon and instructions sent to patient via mail. Patient aware.

## 2019-03-01 ENCOUNTER — Other Ambulatory Visit: Payer: Self-pay | Admitting: Gynecology

## 2019-03-01 DIAGNOSIS — Z1231 Encounter for screening mammogram for malignant neoplasm of breast: Secondary | ICD-10-CM

## 2019-03-07 ENCOUNTER — Other Ambulatory Visit (HOSPITAL_COMMUNITY): Payer: Self-pay | Admitting: Family Medicine

## 2019-03-07 ENCOUNTER — Telehealth: Payer: Self-pay | Admitting: Gastroenterology

## 2019-03-07 DIAGNOSIS — I517 Cardiomegaly: Secondary | ICD-10-CM

## 2019-03-07 NOTE — Telephone Encounter (Signed)

## 2019-03-08 ENCOUNTER — Encounter: Payer: Self-pay | Admitting: Gastroenterology

## 2019-03-08 ENCOUNTER — Other Ambulatory Visit: Payer: Self-pay

## 2019-03-08 ENCOUNTER — Ambulatory Visit (AMBULATORY_SURGERY_CENTER): Payer: 59 | Admitting: Gastroenterology

## 2019-03-08 VITALS — BP 109/56 | HR 72 | Temp 98.2°F | Resp 18 | Ht 65.0 in | Wt 129.0 lb

## 2019-03-08 DIAGNOSIS — Z86010 Personal history of colonic polyps: Secondary | ICD-10-CM | POA: Diagnosis not present

## 2019-03-08 DIAGNOSIS — D125 Benign neoplasm of sigmoid colon: Secondary | ICD-10-CM | POA: Diagnosis not present

## 2019-03-08 MED ORDER — SODIUM CHLORIDE 0.9 % IV SOLN: 500.0000 mL | INTRAVENOUS | Status: DC

## 2019-03-08 NOTE — Patient Instructions (Signed)
YOU HAD AN ENDOSCOPIC PROCEDURE TODAY AT THE [facility]:   Refer to the procedure report that was given to you for any specific questions about what was found during the examination.  If the procedure report does not answer your questions, please call your gastroenterologist to clarify.  If you requested that your care partner not be given the details of your procedure findings, then the procedure report has been included in a sealed envelope for you to review at your convenience later.  YOU SHOULD EXPECT: Some feelings of bloating in the abdomen. Passage of more gas than usual.  Walking can help get rid of the air that was put into your GI tract during the procedure and reduce the bloating. If you had a lower endoscopy (such as a colonoscopy or flexible sigmoidoscopy) you may notice spotting of blood in your stool or on the toilet paper. If you underwent a bowel prep for your procedure, you may not have a normal bowel movement for a few days.  Please Note:  You might notice some irritation and congestion in your nose or some drainage.  This is from the oxygen used during your procedure.  There is no need for concern and it should clear up in a day or so.  SYMPTOMS TO REPORT IMMEDIATELY:   Following lower endoscopy (colonoscopy or flexible sigmoidoscopy):  Excessive amounts of blood in the stool  Significant tenderness or worsening of abdominal pains  Swelling of the abdomen that is new, acute  Fever of 100F or higher  For urgent or emergent issues, a gastroenterologist can be reached at any hour by calling [phone].   DIET:  We do recommend a small meal at first, but then you may proceed to your regular diet.  Drink plenty of fluids but you should avoid alcoholic beverages for 24 hours.  ACTIVITY:  You should plan to take it easy for the rest of today and you should NOT DRIVE or use heavy machinery until tomorrow (because of the sedation medicines used during the test).     FOLLOW UP: Our staff will call the number listed on your records 48-72 hours following your procedure to check on you and address any questions or concerns that you may have regarding the information given to you following your procedure. If we do not reach you, we will leave a message.  We will attempt to reach you two times.  During this call, we will ask if you have developed any symptoms of COVID 19. If you develop any symptoms (ie: fever, flu-like symptoms, shortness of breath, cough etc.) before then, please call [phone].  If you test positive for Covid 19 in the 2 weeks post procedure, please call and report this information to us.    If any biopsies were taken you will be contacted by phone or by letter within the next 1-3 weeks.  Please call us at [phone] if you have not heard about the biopsies in 3 weeks.    SIGNATURES/CONFIDENTIALITY: You and/or your care partner have signed paperwork which will be entered into your electronic medical record.  These signatures attest to the fact that that the information above on your After Visit Summary has been reviewed and is understood.  Full responsibility of the confidentiality of this discharge information lies with you and/or your care-partner. 

## 2019-03-08 NOTE — Progress Notes (Signed)
Report given to PACU, vss 

## 2019-03-08 NOTE — Op Note (Signed)
[facility] Patient Name: Karen Hurst Procedure Date: 03/08/2019 10:37 AM MRN: [id number] Endoscopist: Remo Lipps P. Havery Moros , MD Age: 62 Referring MD:  Date of Birth: 05-04-1956 Gender: Female Account #: [id number] Procedure:                Colonoscopy Indications:              Surveillance: Personal history of adenomatous                            polyps on last colonoscopy in 2014 Medicines:                Monitored Anesthesia Care Procedure:                Pre-Anesthesia Assessment:                           - Prior to the procedure, a History and Physical                            was performed, and patient medications and                            allergies were reviewed. The patient's tolerance of                            previous anesthesia was also reviewed. The risks                            and benefits of the procedure and the sedation                            options and risks were discussed with the patient.                            All questions were answered, and informed consent                            was obtained. Prior Anticoagulants: The patient has                            taken no previous anticoagulant or antiplatelet                            agents. ASA Grade Assessment: II - A patient with                            mild systemic disease. After reviewing the risks                            and benefits, the patient was deemed in                            satisfactory condition to undergo the procedure.  After obtaining informed consent, the colonoscope                            was passed under direct vision. Throughout the                            procedure, the patient's blood pressure, pulse, and                            oxygen saturations were monitored continuously. The                            Colonoscope was introduced through the anus and                            advanced to the the  cecum, identified by                            appendiceal orifice and ileocecal valve. The                            colonoscopy was performed without difficulty. The                            patient tolerated the procedure well. The quality                            of the bowel preparation was good. The ileocecal                            valve, appendiceal orifice, and rectum were                            photographed. Scope In: 10:44:58 AM Scope Out: 11:01:00 AM Scope Withdrawal Time: 0 hours 13 minutes 23 seconds  Total Procedure Duration: 0 hours 16 minutes 2 seconds  Findings:                 The perianal and digital rectal examinations were                            normal.                           Two sessile polyps were found in the sigmoid colon.                            The polyps were 3 to 4 mm in size. These polyps                            were removed with a cold snare. Resection and                            retrieval were complete.  A few small-mouthed diverticula were found in the                            sigmoid colon.                           The exam was otherwise without abnormality. Complications:            No immediate complications. Estimated blood loss:                            Minimal. Estimated Blood Loss:     Estimated blood loss was minimal. Impression:               - Two 3 to 4 mm polyps in the sigmoid colon,                            removed with a cold snare. Resected and retrieved.                           - Diverticulosis in the sigmoid colon.                           - The examination was otherwise normal. Recommendation:           - Patient has a contact number available for                            emergencies. The signs and symptoms of potential                            delayed complications were discussed with the                            patient. Return to normal activities tomorrow.                             Written discharge instructions were provided to the                            patient.                           - Resume previous diet.                           - Continue present medications.                           - Await pathology results. Remo Lipps P. Armbruster, MD 03/08/2019 11:03:50 AM This report has been signed electronically.

## 2019-03-12 ENCOUNTER — Telehealth: Payer: Self-pay

## 2019-03-12 NOTE — Telephone Encounter (Signed)
  Follow up Call-  Call back number 03/08/2019  Post procedure Call Back phone  # [phone]  Permission to leave phone message Yes  Some recent data might be hidden     Patient questions:  Do you have a fever, pain , or abdominal swelling? No. Pain Score  0 *  Have you tolerated food without any problems? Yes.    Have you been able to return to your normal activities? Yes.    Do you have any questions about your discharge instructions: Diet   No. Medications  No. Follow up visit  No.  Do you have questions or concerns about your Care? No.  Actions: * If pain score is 4 or above: 1. No action needed, pain <4.Have you developed a fever since your procedure? no  2.   Have you had an respiratory symptoms (SOB or cough) since your procedure? no  3.   Have you tested positive for COVID 19 since your procedure no  4.   Have you had any family members/close contacts diagnosed with the COVID 19 since your procedure?  no   If yes to any of these questions please route to Joylene John, RN and Alphonsa Gin, Therapist, sports.

## 2019-03-13 ENCOUNTER — Ambulatory Visit (HOSPITAL_COMMUNITY): Payer: 59 | Attending: Cardiology

## 2019-03-13 ENCOUNTER — Other Ambulatory Visit: Payer: Self-pay

## 2019-03-13 DIAGNOSIS — I517 Cardiomegaly: Secondary | ICD-10-CM | POA: Insufficient documentation

## 2019-03-16 ENCOUNTER — Ambulatory Visit
Admission: RE | Admit: 2019-03-16 | Discharge: 2019-03-16 | Disposition: A | Discharge status: Home / Self Care | Payer: 59 | Source: Ambulatory Visit

## 2019-03-16 ENCOUNTER — Other Ambulatory Visit: Payer: Self-pay

## 2019-03-16 DIAGNOSIS — Z1231 Encounter for screening mammogram for malignant neoplasm of breast: Secondary | ICD-10-CM

## 2019-04-20 ENCOUNTER — Ambulatory Visit
Admission: RE | Admit: 2019-04-20 | Discharge: 2019-04-20 | Disposition: A | Discharge status: Home / Self Care | Payer: BLUE CROSS/BLUE SHIELD | Source: Ambulatory Visit | Attending: Gynecology | Admitting: Gynecology

## 2019-04-20 ENCOUNTER — Other Ambulatory Visit: Payer: Self-pay

## 2019-04-20 DIAGNOSIS — M810 Age-related osteoporosis without current pathological fracture: Secondary | ICD-10-CM

## 2019-04-23 ENCOUNTER — Encounter: Payer: Self-pay | Admitting: Gynecology

## 2019-04-24 ENCOUNTER — Encounter: Payer: Self-pay | Admitting: Gynecology

## 2019-10-11 ENCOUNTER — Encounter: Payer: 59 | Admitting: Obstetrics and Gynecology

## 2019-11-06 ENCOUNTER — Encounter: Payer: Self-pay | Admitting: Obstetrics and Gynecology

## 2019-11-06 ENCOUNTER — Other Ambulatory Visit: Payer: Self-pay

## 2019-11-06 ENCOUNTER — Ambulatory Visit (INDEPENDENT_AMBULATORY_CARE_PROVIDER_SITE_OTHER): Payer: 59 | Admitting: Obstetrics and Gynecology

## 2019-11-06 VITALS — BP 118/78 | Ht 64.0 in | Wt 132.0 lb

## 2019-11-06 DIAGNOSIS — Z01419 Encounter for gynecological examination (general) (routine) without abnormal findings: Secondary | ICD-10-CM | POA: Diagnosis not present

## 2019-11-06 DIAGNOSIS — Z124 Encounter for screening for malignant neoplasm of cervix: Secondary | ICD-10-CM

## 2019-11-06 NOTE — Progress Notes (Signed)
Karen Hurst [birth year] [id number]  SUBJECTIVE:  64 y.o. G2P2002 female for annual routine gynecologic exam and Pap smear. She has no gynecologic concerns.  Current Outpatient Medications  Medication Sig Dispense Refill  . ALPRAZolam (XANAX) 0.5 MG tablet Take 0.5 mg by mouth at bedtime as needed.    . Amphetamine-Dextroamphetamine (AMPHETAMINE SALT COMBO PO) Take by mouth.    . ATORVASTATIN CALCIUM PO Take by mouth.    Marland Kitchen buPROPion (WELLBUTRIN XL) 300 MG 24 hr tablet Take 300 mg by mouth daily.    . Cholecalciferol (VITAMIN D PO) Take 1,000 Units by mouth.    . citalopram (CELEXA) 10 MG tablet Take 10 mg by mouth daily.    . clobetasol cream (TEMOVATE) 0.05 % Apply hs prn for irritation. 30 g 2  . Loratadine (CLARITIN PO) Take by mouth.    . rosuvastatin (CRESTOR) 40 MG tablet TAKE 1 TABLET BY MOUTH IN THE EVENING AFTER MEALS     No current facility-administered medications for this visit.   Allergies: Adhesive [tape], Latex, and Other  No LMP recorded. Patient is postmenopausal.  Past medical history,surgical history, problem list, medications, allergies, family history and social history were all reviewed and documented as reviewed in the EPIC chart.  ROS:  Feeling well. No dyspnea or chest pain on exertion.  No abdominal pain, change in bowel habits, black or bloody stools.  No urinary tract symptoms. GYN ROS: no abnormal bleeding, pelvic pain or discharge, no breast pain or new or enlarging lumps on self exam. No neurological complaints.    OBJECTIVE:  BP 118/78   Ht 5\' 4"  (1.626 m)   Wt 132 lb (59.9 kg)   BMI 22.66 kg/m  The patient appears well, alert, oriented x 3, in no distress. ENT normal.  Neck supple. No cervical or supraclavicular adenopathy or thyromegaly.  Lungs are clear, good air entry, no wheezes, rhonchi or rales. S1 and S2 normal, no murmurs, regular rate and rhythm.  Abdomen soft without tenderness, guarding, mass or organomegaly.  Neurological is  normal, no focal findings.  BREAST EXAM: breasts appear normal, no suspicious masses, no skin or nipple changes or axillary nodes  PELVIC EXAM: VULVA: normal appearing vulva with no masses, tenderness or lesions, mild blanching of mid labia and posteriorly, bilaterally, VAGINA: normal appearing vagina with normal color and discharge, no lesions, CERVIX: Deviated to patient's left, normal atrophic appearing cervix without discharge or lesions, UTERUS: Retroverted uterus is normal size, shape, consistency and nontender, ADNEXA: normal adnexa in size, nontender and no masses, PAP: Pap smear done today, thin-prep method  Chaperone: [name] present during the examination  ASSESSMENT:  64 y.o. [id number] here for annual gynecologic exam  PLAN:   1. Postmenopausal.  No hormonal concerns.  No vaginal bleeding. 2. Pap smear 09/2016.  Pap smear is collected today.  Current screening guidelines calling for the 3. History of leiomyoma.  Uterus feels normal on exam today. 4. History of lichen sclerosis.  Proven by previous biopsy.  Clobetasol 0.05% cream as needed, she indicates she has a sufficient supply. 5. Mammogram 02/2019.  Normal breast exam today.  She is reminded to schedule an annual mammogram this year when due. 6. Colonoscopy 2014.  Recommended that she follow up at the recommended interval.   7. Osteoporosis.  DEXA 03/2019 with T score -2.6 (left femur neck), essentially stable.  She is supplementing with vitamin D and ensuring she gets enough calcium from dietary sources, staying physically active.  Actonel 150 mg monthly  was used for 4 years, she stopped in 2017.  Next DEXA recommended in 2022. 8. Health maintenance.  No labs today as she normally has these completed with her primary care provider.   Return annually or sooner, prn.  Joseph Pierini MD, FACOG  11/06/19

## 2019-11-06 NOTE — Addendum Note (Signed)
Addended by: Nelva Nay on: 11/06/2019 02:56 PM   Modules accepted: Orders

## 2019-11-07 LAB — PAP IG W/ RFLX HPV ASCU

## 2020-06-09 IMAGING — MG DIGITAL SCREENING BILATERAL MAMMOGRAM WITH TOMO AND CAD
8 series · 8 of 24 positions shown · non-contrast
Comparison: Previous exam(s).

CLINICAL DATA: Screening.

EXAM:
DIGITAL SCREENING BILATERAL MAMMOGRAM WITH TOMO AND CAD

[L MLO synth-2D]
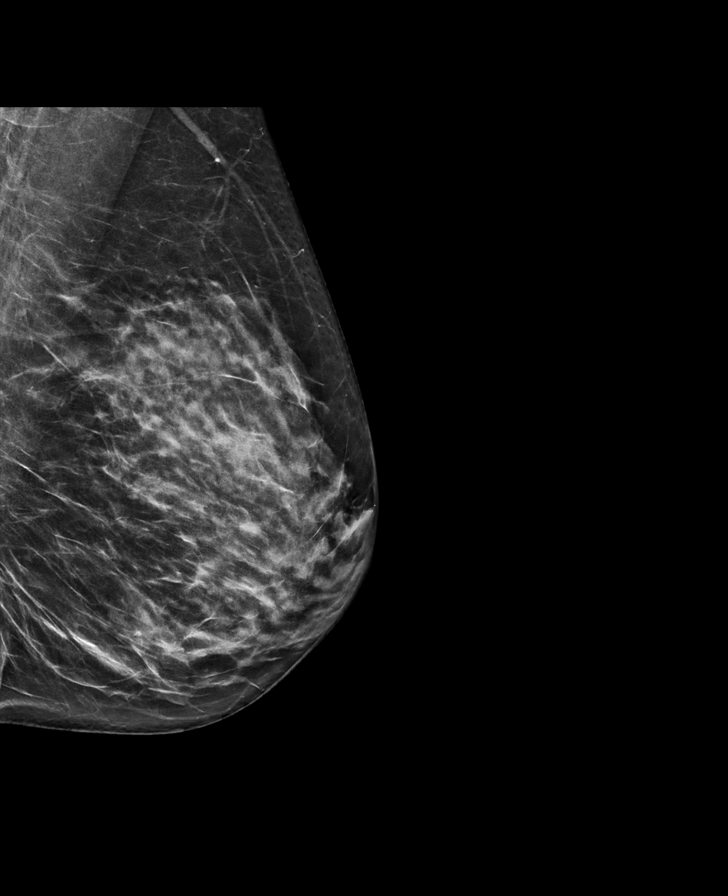

[R CC synth-2D]
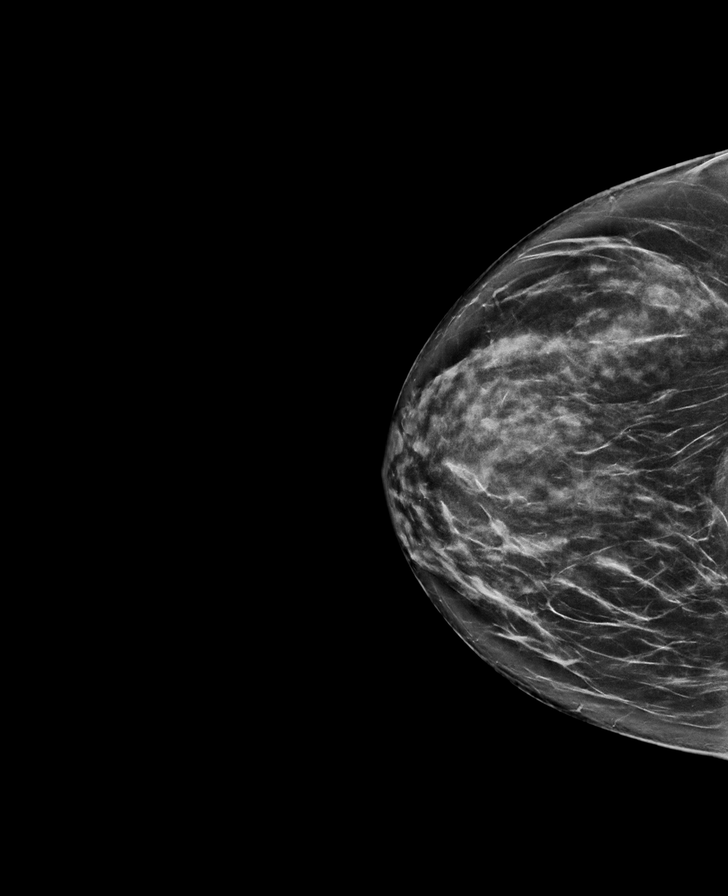

[R MLO synth-2D]
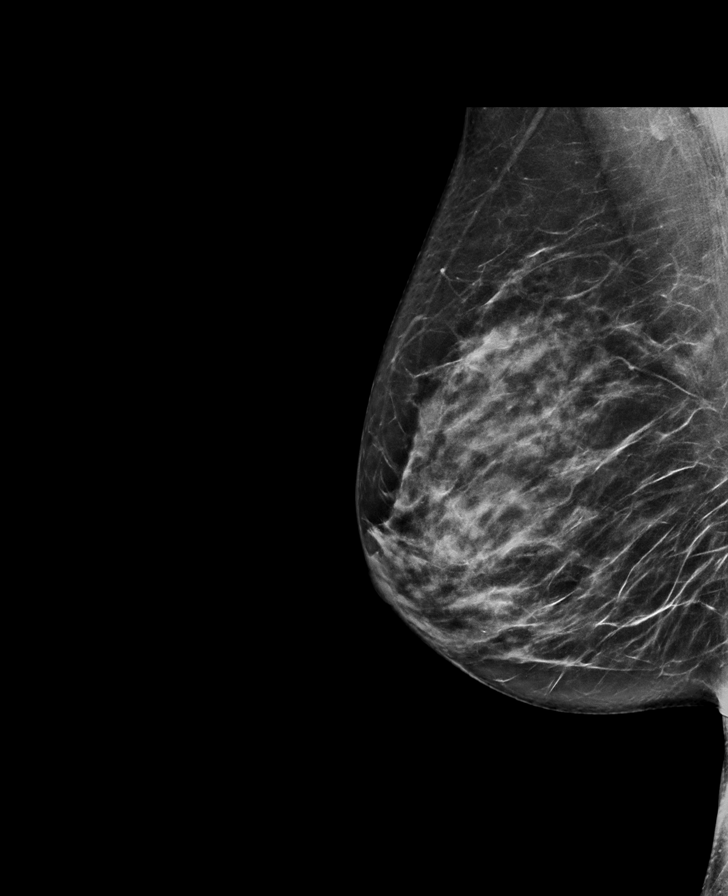

[L CC synth-2D]
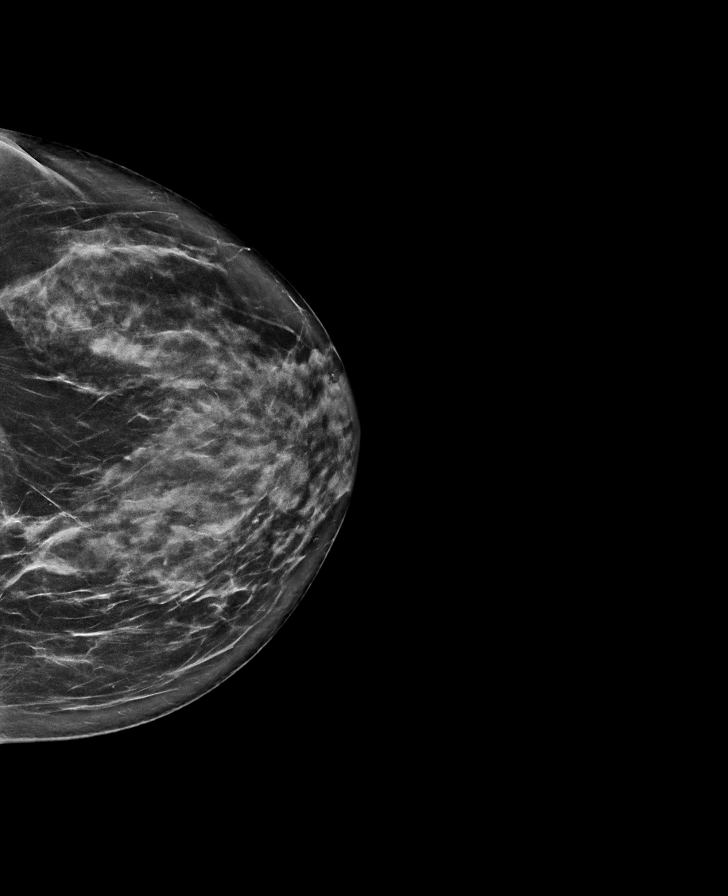

[L CC tomo · tomo slice 37/73.0]
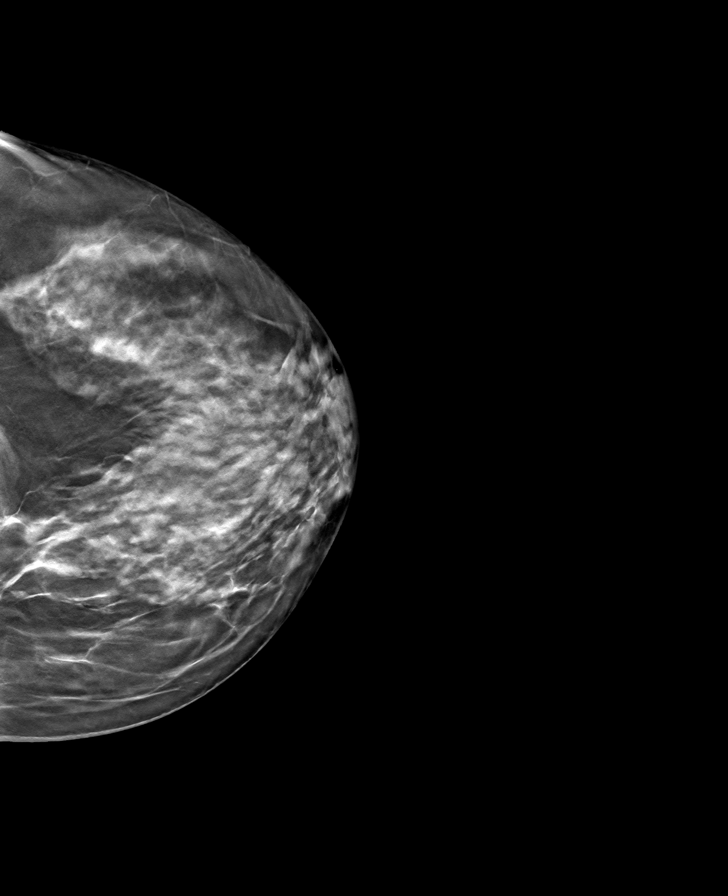

[R MLO tomo · tomo slice 37/74.0]
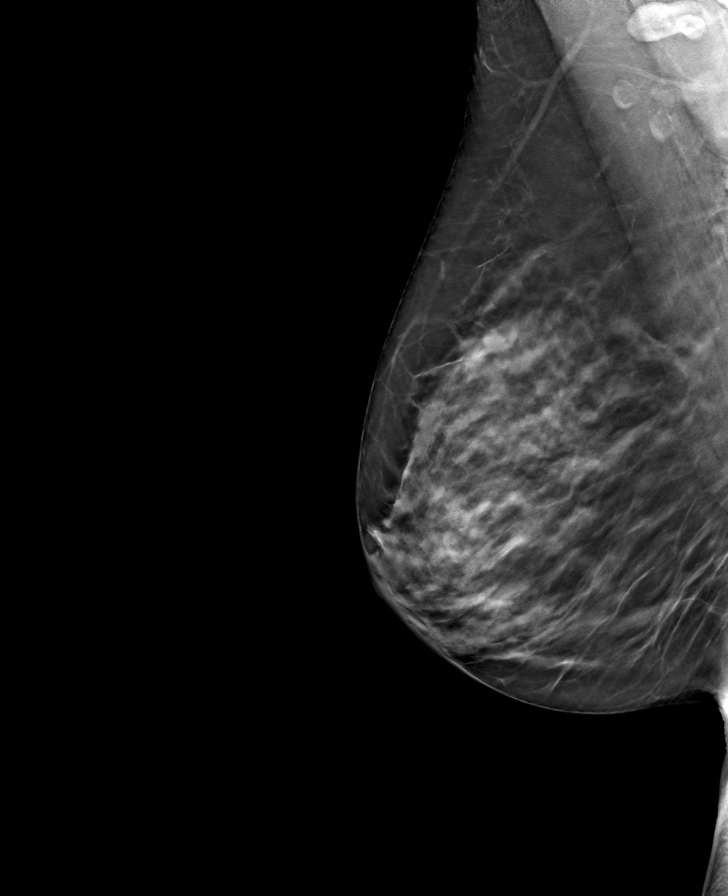

[R CC tomo · tomo slice 37/72.0]
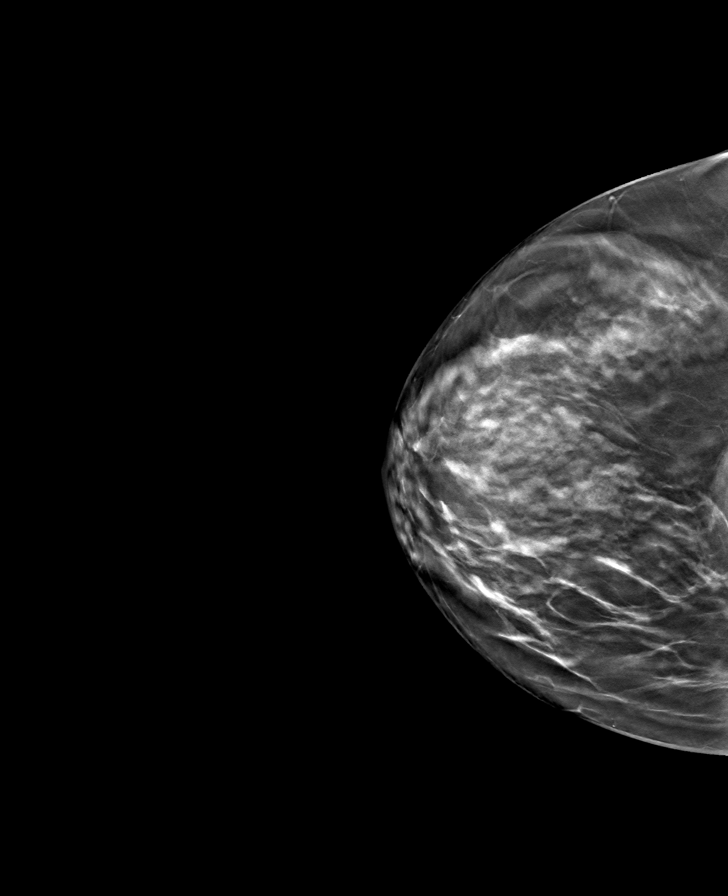

[L MLO tomo · tomo slice 35/70.0]
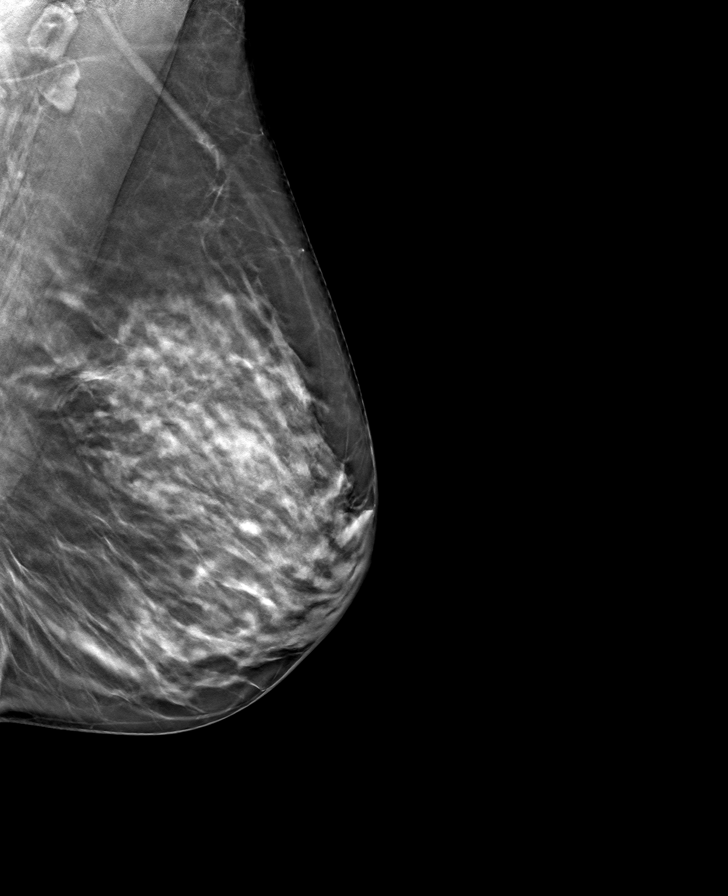

[8 of 24 positions shown; findings below may reference images not displayed]

ACR Breast Density Category c: The breast tissue is heterogeneously
dense, which may obscure small masses.
FINDINGS: There are no findings suspicious for malignancy. Images were
processed with CAD.
IMPRESSION: No mammographic evidence of malignancy. A result letter of this
screening mammogram will be mailed directly to the patient.

RECOMMENDATION:
Screening mammogram in one year. (Code:FT-U-LHB)

BI-RADS CATEGORY  1: Negative.

## 2021-09-02 ENCOUNTER — Other Ambulatory Visit: Payer: Self-pay | Admitting: Family Medicine

## 2021-09-02 DIAGNOSIS — Z13820 Encounter for screening for osteoporosis: Secondary | ICD-10-CM

## 2021-09-02 DIAGNOSIS — Z1231 Encounter for screening mammogram for malignant neoplasm of breast: Secondary | ICD-10-CM

## 2021-09-17 ENCOUNTER — Ambulatory Visit
Admission: RE | Admit: 2021-09-17 | Discharge: 2021-09-17 | Disposition: A | Discharge status: Home / Self Care | Payer: Self-pay | Source: Ambulatory Visit | Attending: Family Medicine | Admitting: Family Medicine

## 2021-09-17 ENCOUNTER — Other Ambulatory Visit: Payer: Self-pay

## 2021-09-17 ENCOUNTER — Other Ambulatory Visit: Payer: Self-pay | Admitting: Family Medicine

## 2021-09-17 DIAGNOSIS — G8929 Other chronic pain: Secondary | ICD-10-CM

## 2021-09-17 DIAGNOSIS — M25552 Pain in left hip: Secondary | ICD-10-CM

## 2021-09-17 DIAGNOSIS — M25551 Pain in right hip: Secondary | ICD-10-CM

## 2022-02-01 ENCOUNTER — Ambulatory Visit
Admission: RE | Admit: 2022-02-01 | Discharge: 2022-02-01 | Disposition: A | Discharge status: Home / Self Care | Payer: No Typology Code available for payment source | Source: Ambulatory Visit | Attending: Family Medicine | Admitting: Family Medicine

## 2022-02-01 ENCOUNTER — Ambulatory Visit
Admission: RE | Admit: 2022-02-01 | Discharge: 2022-02-01 | Disposition: A | Discharge status: Home / Self Care | Payer: 59 | Source: Ambulatory Visit | Attending: Family Medicine | Admitting: Family Medicine

## 2022-02-01 DIAGNOSIS — Z13820 Encounter for screening for osteoporosis: Secondary | ICD-10-CM

## 2022-02-01 DIAGNOSIS — Z1231 Encounter for screening mammogram for malignant neoplasm of breast: Secondary | ICD-10-CM

## 2022-02-16 ENCOUNTER — Telehealth: Payer: Self-pay | Admitting: *Deleted

## 2022-02-16 NOTE — Telephone Encounter (Signed)
Dr.Lavoie wrote on bone density paper results from the breast center ( results are in epic as well) notes has osteoporosis. Dr.Lavoie wanted to know if patient was going to follow up with Dr. Sandi Mariscal (PCP) or here. Patient last seen in 2021,overdue for annual exam. Patient said she still wants to continue care here. I suggested she schedule an office visit with a provider for annual exam. Patient said she just woke up and will call back.

## 2023-03-16 ENCOUNTER — Other Ambulatory Visit: Payer: Self-pay | Admitting: Family Medicine

## 2023-03-16 DIAGNOSIS — Z1231 Encounter for screening mammogram for malignant neoplasm of breast: Secondary | ICD-10-CM

## 2023-03-17 ENCOUNTER — Ambulatory Visit (HOSPITAL_BASED_OUTPATIENT_CLINIC_OR_DEPARTMENT_OTHER)
Admission: RE | Admit: 2023-03-17 | Discharge: 2023-03-17 | Disposition: A | Discharge status: Home / Self Care | Payer: No Typology Code available for payment source | Source: Ambulatory Visit | Attending: Family Medicine | Admitting: Family Medicine

## 2023-03-17 DIAGNOSIS — Z1231 Encounter for screening mammogram for malignant neoplasm of breast: Secondary | ICD-10-CM | POA: Insufficient documentation

## 2024-05-03 ENCOUNTER — Encounter: Payer: Self-pay | Admitting: Family Medicine

## 2024-05-05 ENCOUNTER — Other Ambulatory Visit (HOSPITAL_BASED_OUTPATIENT_CLINIC_OR_DEPARTMENT_OTHER): Payer: Self-pay | Admitting: Family Medicine

## 2024-05-05 DIAGNOSIS — M810 Age-related osteoporosis without current pathological fracture: Secondary | ICD-10-CM

## 2024-05-05 DIAGNOSIS — Z1231 Encounter for screening mammogram for malignant neoplasm of breast: Secondary | ICD-10-CM

## 2024-08-08 ENCOUNTER — Other Ambulatory Visit (HOSPITAL_BASED_OUTPATIENT_CLINIC_OR_DEPARTMENT_OTHER): Payer: Self-pay | Admitting: Family Medicine

## 2024-08-08 DIAGNOSIS — E782 Mixed hyperlipidemia: Secondary | ICD-10-CM

## 2024-08-21 ENCOUNTER — Ambulatory Visit (HOSPITAL_BASED_OUTPATIENT_CLINIC_OR_DEPARTMENT_OTHER)
Admission: RE | Admit: 2024-08-21 | Discharge: 2024-08-21 | Disposition: A | Discharge status: Home / Self Care | Payer: Self-pay | Source: Ambulatory Visit | Attending: Family Medicine | Admitting: Family Medicine

## 2024-08-21 DIAGNOSIS — E782 Mixed hyperlipidemia: Secondary | ICD-10-CM | POA: Insufficient documentation

## 2024-08-29 ENCOUNTER — Ambulatory Visit (HOSPITAL_COMMUNITY): Admission: RE | Admit: 2024-08-29 | Discharge: 2024-08-29 | Payer: Self-pay | Attending: Surgery | Admitting: Surgery

## 2024-08-29 ENCOUNTER — Other Ambulatory Visit (HOSPITAL_COMMUNITY): Payer: Self-pay | Admitting: Family Medicine

## 2024-08-29 DIAGNOSIS — E782 Mixed hyperlipidemia: Secondary | ICD-10-CM

## 2025-03-27 ENCOUNTER — Other Ambulatory Visit (HOSPITAL_BASED_OUTPATIENT_CLINIC_OR_DEPARTMENT_OTHER)
